# Patient Record
Sex: Female | Born: 1965 | Hispanic: No | Marital: Married | Smoking: Never smoker
Health system: Southern US, Community
[De-identification: ages and names within clinical notes are randomized; demographics above are authoritative.]

---

## 2008-12-14 ENCOUNTER — Ambulatory Visit: Payer: Self-pay | Admitting: Obstetrics & Gynecology

## 2008-12-25 ENCOUNTER — Ambulatory Visit (HOSPITAL_COMMUNITY): Admission: RE | Admit: 2008-12-25 | Discharge: 2008-12-25 | Payer: Self-pay | Admitting: Family Medicine

## 2009-01-09 ENCOUNTER — Ambulatory Visit: Payer: Self-pay | Admitting: Obstetrics and Gynecology

## 2009-04-17 ENCOUNTER — Ambulatory Visit: Payer: Self-pay | Admitting: Obstetrics and Gynecology

## 2009-05-15 ENCOUNTER — Ambulatory Visit: Payer: Self-pay | Admitting: Gastroenterology

## 2009-05-15 DIAGNOSIS — R197 Diarrhea, unspecified: Secondary | ICD-10-CM

## 2009-05-15 DIAGNOSIS — R112 Nausea with vomiting, unspecified: Secondary | ICD-10-CM

## 2009-05-16 LAB — CONVERTED CEMR LAB
ALT: 13 units/L (ref 0–35)
Albumin: 3.5 g/dL (ref 3.5–5.2)
Alkaline Phosphatase: 80 units/L (ref 39–117)
Basophils Absolute: 0 10*3/uL (ref 0.0–0.1)
Eosinophils Relative: 1.6 % (ref 0.0–5.0)
HCT: 34.9 % — ABNORMAL LOW (ref 36.0–46.0)
Lymphs Abs: 1.3 10*3/uL (ref 0.7–4.0)
Monocytes Absolute: 0.5 10*3/uL (ref 0.1–1.0)
Monocytes Relative: 7.6 % (ref 3.0–12.0)
Neutrophils Relative %: 70.9 % (ref 43.0–77.0)
Platelets: 289 10*3/uL (ref 150.0–400.0)
Potassium: 3.5 meq/L (ref 3.5–5.1)
RDW: 14.7 % — ABNORMAL HIGH (ref 11.5–14.6)
Sodium: 142 meq/L (ref 135–145)
Total Bilirubin: 0.6 mg/dL (ref 0.3–1.2)
Total Protein: 6.5 g/dL (ref 6.0–8.3)
WBC: 6.7 10*3/uL (ref 4.5–10.5)

## 2009-05-27 ENCOUNTER — Ambulatory Visit: Payer: Self-pay | Admitting: Gastroenterology

## 2009-05-27 ENCOUNTER — Encounter (INDEPENDENT_AMBULATORY_CARE_PROVIDER_SITE_OTHER): Payer: Self-pay | Admitting: *Deleted

## 2009-05-27 ENCOUNTER — Telehealth (INDEPENDENT_AMBULATORY_CARE_PROVIDER_SITE_OTHER): Payer: Self-pay | Admitting: *Deleted

## 2009-05-27 ENCOUNTER — Encounter: Payer: Self-pay | Admitting: Gastroenterology

## 2009-05-28 ENCOUNTER — Telehealth: Payer: Self-pay | Admitting: Gastroenterology

## 2009-05-29 ENCOUNTER — Encounter: Payer: Self-pay | Admitting: Gastroenterology

## 2009-05-29 ENCOUNTER — Telehealth (INDEPENDENT_AMBULATORY_CARE_PROVIDER_SITE_OTHER): Payer: Self-pay | Admitting: *Deleted

## 2009-07-09 ENCOUNTER — Ambulatory Visit: Payer: Self-pay | Admitting: Gastroenterology

## 2010-04-09 IMAGING — US US PELVIS COMPLETE MODIFY
1 series · 14 of 25 positions shown · non-contrast
Comparison: 

CLINICAL DATA: TRANSABDOMINAL AND TRANSVAGINAL ULTRASOUND OF PELVIS
TECHNIQUE: Both transabdominal and transvaginal ultrasound
examinations of the pelvis were performed including evaluation of
the uterus, ovaries, adnexal regions, and pelvic cul-de-sac.

[Series 1: us transvaginal non-ob · 0.27mm/px · 44 acquisitions, 14 frames shown]
[im 1/44]
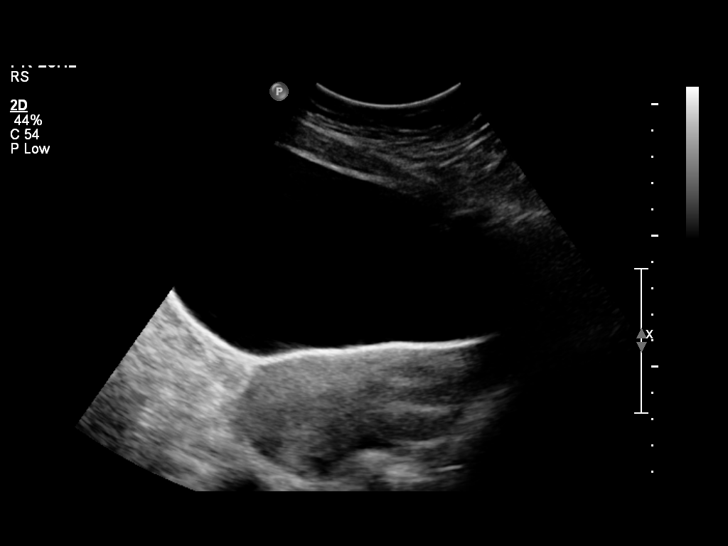
[im 4/44]
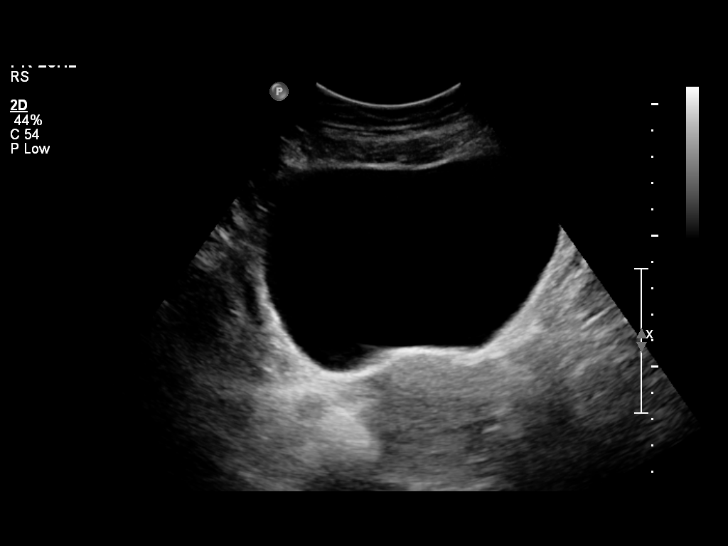
[im 8/44]
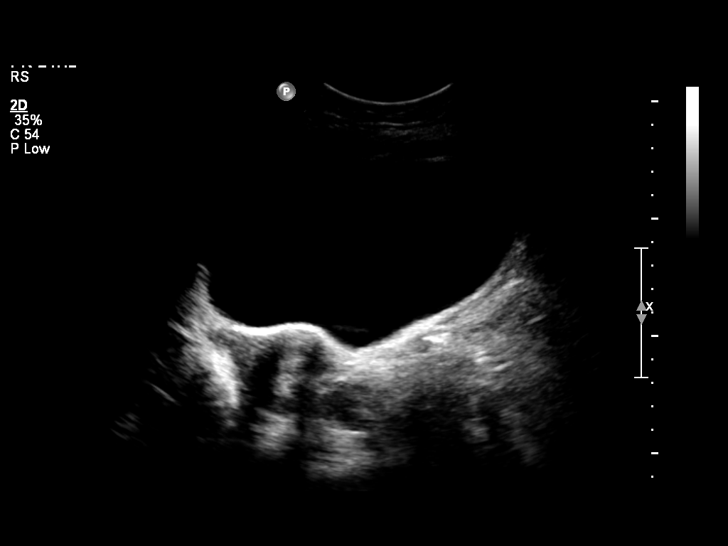
[im 11/44]
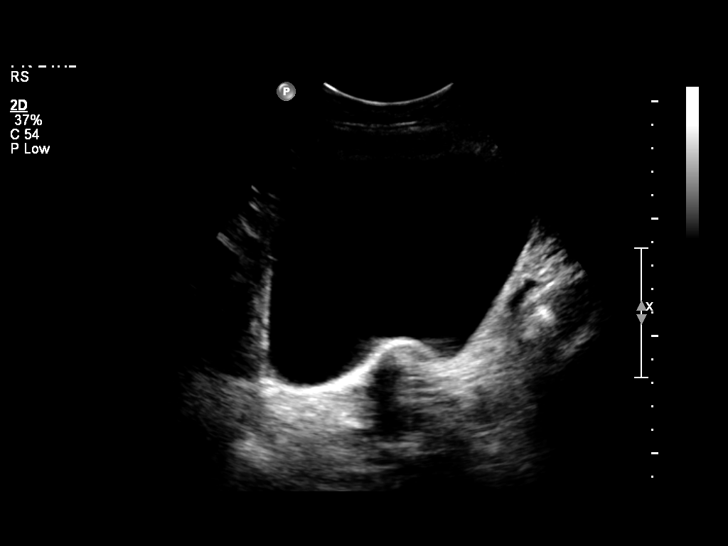
[im 15/44]
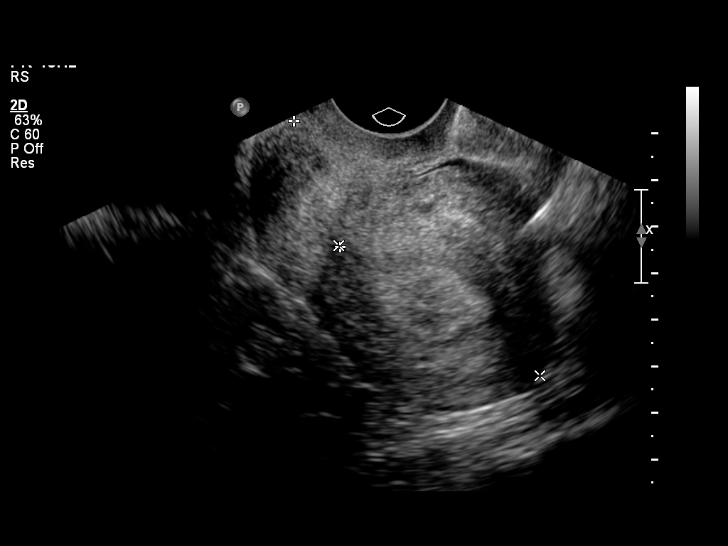
[im 17/44]
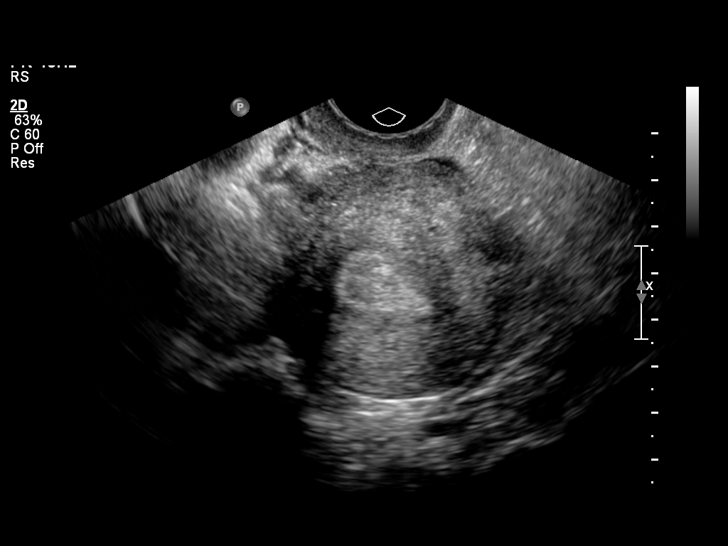
[im 20/44]
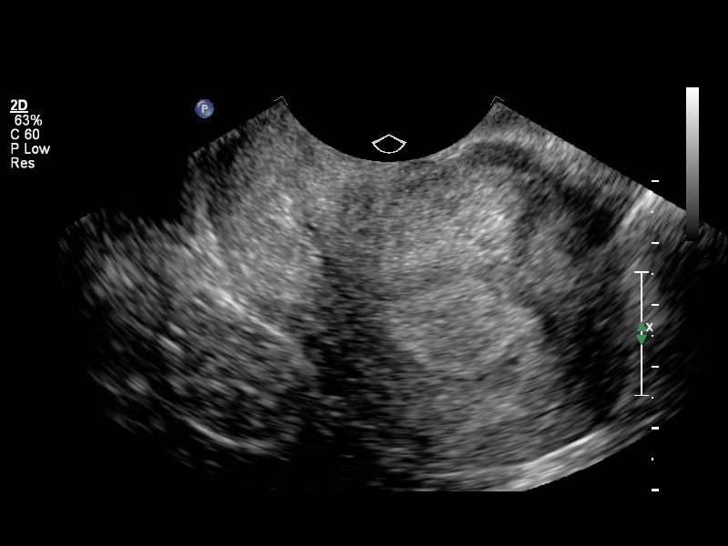
[im 24/44]
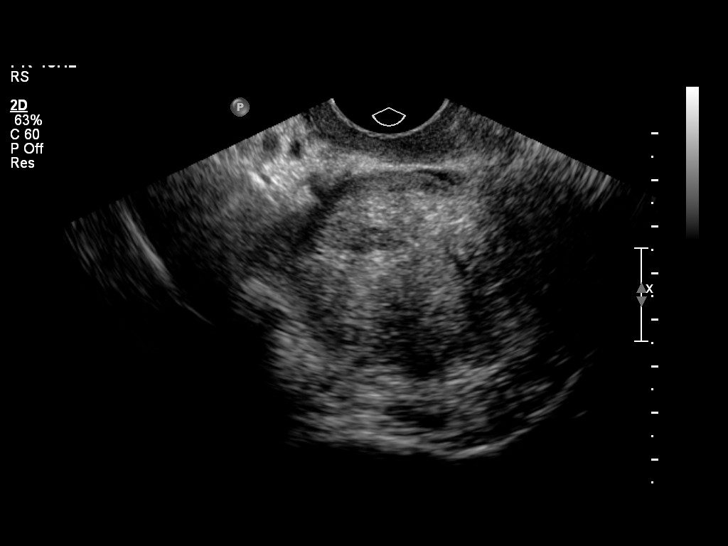
[im 27/44]
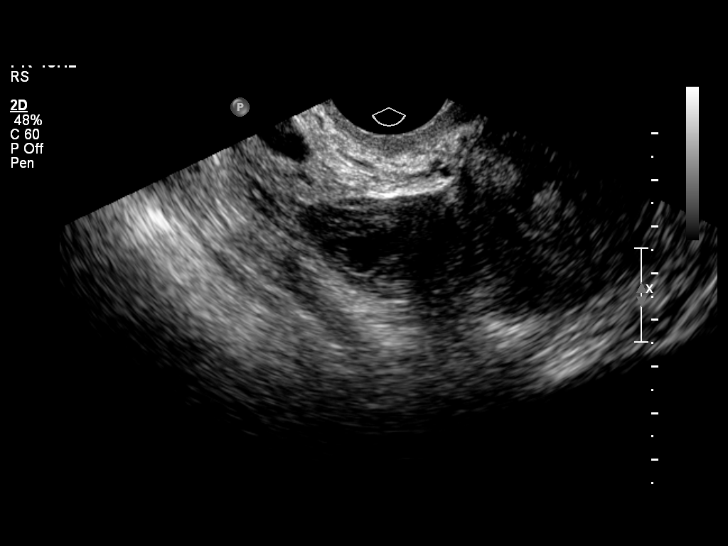
[im 29/44]
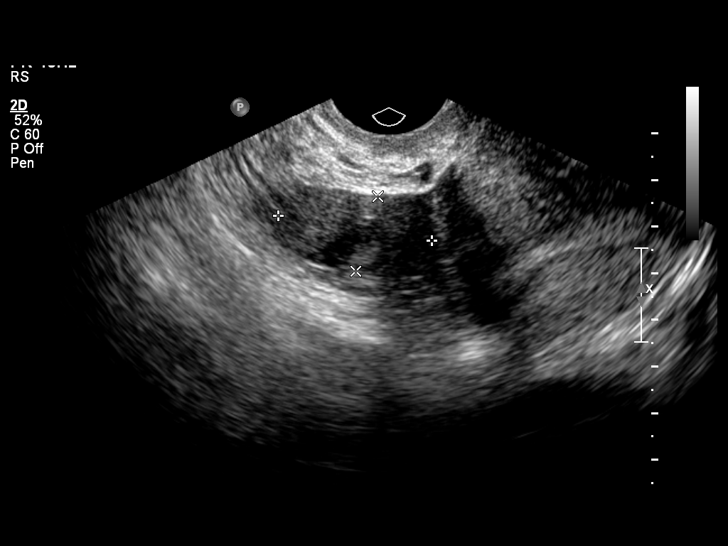
[im 33/44]
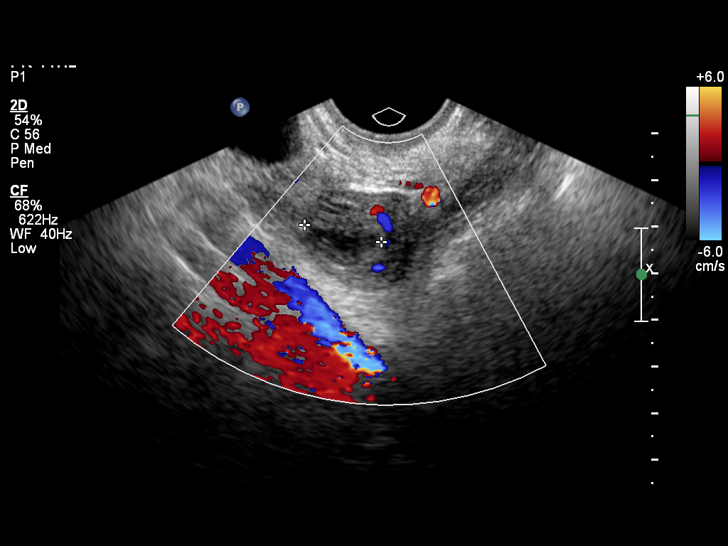
[im 36/44]
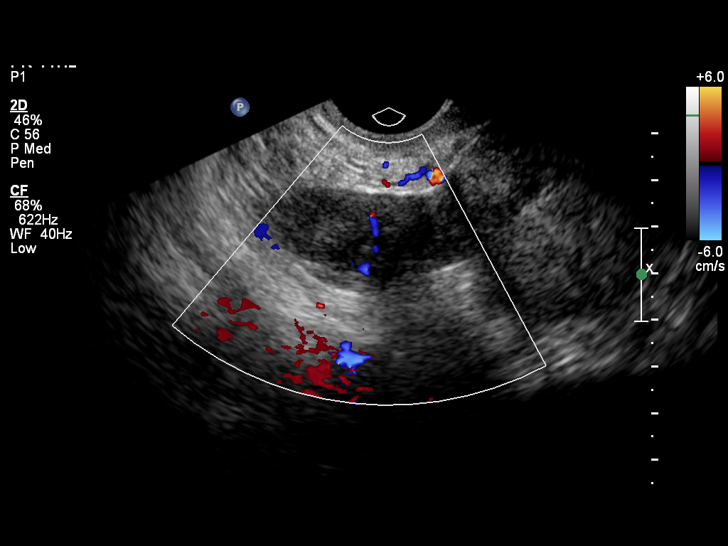
[im 40/44]
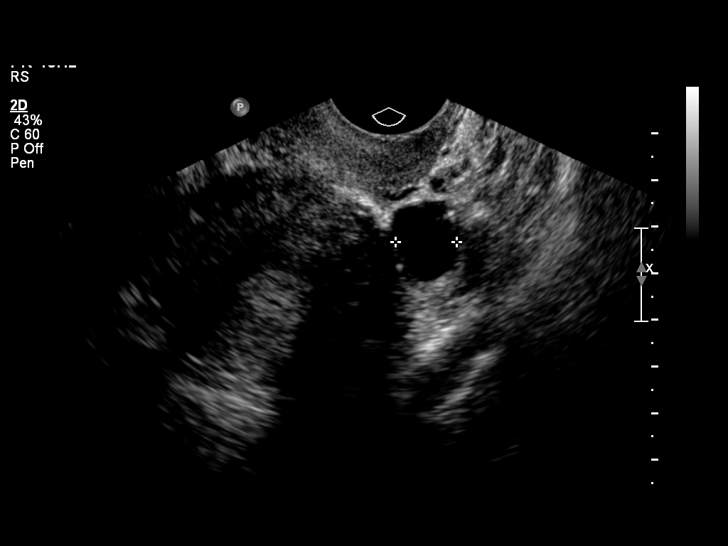
[im 44/44]
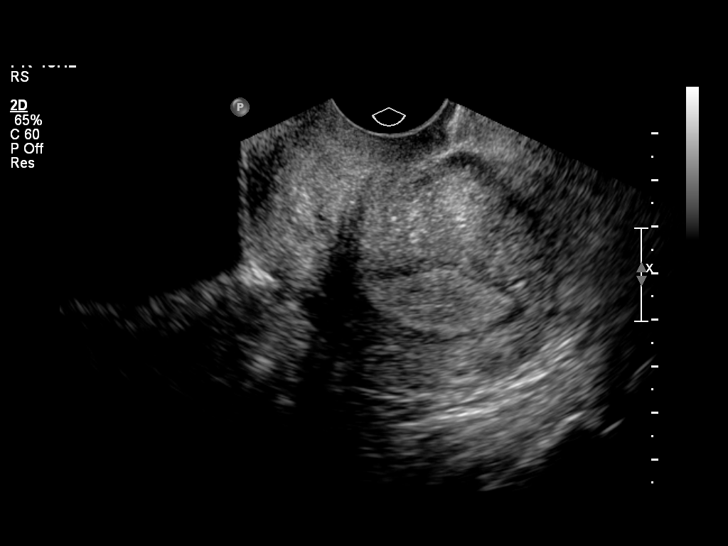

[14 of 25 positions shown; findings below may reference images not displayed]

FINDINGS: Uterus:  No fibroids or other uterine masses identified.

Endometrium:  Within normal limits in appearance and thickness,
measuring 12mm.

Right Ovary:  Normal size and appearance.

Left Ovary:  Normal size and appearance.

Other Findings:  No adnexal mass or free fluid identified.
IMPRESSION: Normal study.  No evidence of pelvic mass or other significant
abnormality.

## 2010-11-11 LAB — POCT URINALYSIS DIP (DEVICE)
Hgb urine dipstick: NEGATIVE
Ketones, ur: NEGATIVE mg/dL
Protein, ur: NEGATIVE mg/dL
Specific Gravity, Urine: 1.015 (ref 1.005–1.030)
Urobilinogen, UA: 0.2 mg/dL (ref 0.0–1.0)
pH: 8.5 — ABNORMAL HIGH (ref 5.0–8.0)

## 2010-12-16 NOTE — Group Therapy Note (Signed)
Lacey Keller, Lacey Keller NO.:  [id number]   MEDICAL RECORD NO.:  [id number]          PATIENT TYPE:  WOC   LOCATION:  [facility]                   FACILITY:  [facility]   PHYSICIAN:  Scheryl Darter, MD       DATE OF BIRTH:  Dec 18, 1965   DATE OF SERVICE:  12/14/2008                                  CLINIC NOTE   HISTORY OF PRESENT ILLNESS:  The patient comes for followup after being  diagnosed with a left ovarian cyst by ultrasound done at [facility].  The patient is a 45 year old [place] female gravida 0, last menstrual  period Dec 01, 2008, has had right lower quadrant pain intermittently for  months.  The pain follows her menses and lasts for up to 2 weeks.  She  also has daily nausea and she says during the time that she is having  pain,  she also has diarrhea.  No fevers.  No abnormal discharge and  periods are regular and lasts just a few a days and are not heavy.  She  would like to conceive.  She has been married for 16 years.   PAST MEDICAL HISTORY:  Hemorrhoids   PAST SURGICAL HISTORY:  Hemorrhoidectomy done in 2006 in [place]   SOCIAL HISTORY:  The patient is married.  She denies alcohol, tobacco,  or drug use.  She speaks some English, but the interpreter is here to  help today.   REVIEW OF SYSTEMS:  Includes increased urinary frequency and occasional  loss of urine.  No dysuria.   ALLERGIES:  Allergy to penicillin.   MEDICATIONS:  She has pain medicine that she cannot name.   FAMILY HISTORY:  Unremarkable.   PHYSICAL EXAMINATION:  GENERAL:  The patient in no acute distress.  VITAL SIGNS:  Weight 163.6 pounds.  Height 5 feet 5, blood pressure  117/71, pulse 80, and temperature 97.2.  ABDOMEN:  Nondistended, soft, nontender, and no mass.  PELVIC:  External genitalia, vagina, and cervix appeared normal.  She  reports some very mild inconsistent cervical motion tenderness.  Uterus  is normal size.  No adnexal masses or tenderness.   IMPRESSION:  1. Right  lower quadrant pain and ovarian cyst by history.  We do not      have the ultrasound report today.  We will try to obtain this from      Dr. Dimple Casey in [facility].  2. Gastrointestinal symptoms with diarrhea and nausea.   PLAN:  Repeat ultrasound.  She will return in 2 weeks to review the  result.  She may also need a GI referral.      Scheryl Darter, MD     JA/MEDQ  D:  12/14/2008  T:  12/15/2008  Job:  [id number]

## 2015-03-12 ENCOUNTER — Encounter: Payer: Self-pay | Admitting: Gastroenterology

## 2016-11-09 ENCOUNTER — Emergency Department (HOSPITAL_BASED_OUTPATIENT_CLINIC_OR_DEPARTMENT_OTHER)
Admission: EM | Admit: 2016-11-09 | Discharge: 2016-11-09 | Disposition: A | Discharge status: Home / Self Care | Payer: Managed Care, Other (non HMO) | Attending: Emergency Medicine | Admitting: Emergency Medicine

## 2016-11-09 ENCOUNTER — Encounter (HOSPITAL_BASED_OUTPATIENT_CLINIC_OR_DEPARTMENT_OTHER): Payer: Self-pay | Admitting: Emergency Medicine

## 2016-11-09 DIAGNOSIS — S060X0A Concussion without loss of consciousness, initial encounter: Secondary | ICD-10-CM | POA: Insufficient documentation

## 2016-11-09 DIAGNOSIS — Y92410 Unspecified street and highway as the place of occurrence of the external cause: Secondary | ICD-10-CM | POA: Insufficient documentation

## 2016-11-09 DIAGNOSIS — Y939 Activity, unspecified: Secondary | ICD-10-CM | POA: Diagnosis not present

## 2016-11-09 DIAGNOSIS — Y999 Unspecified external cause status: Secondary | ICD-10-CM | POA: Diagnosis not present

## 2016-11-09 DIAGNOSIS — S0990XA Unspecified injury of head, initial encounter: Secondary | ICD-10-CM | POA: Diagnosis present

## 2016-11-09 NOTE — ED Notes (Signed)
Pt ambulatory with steady gait at d/c. States she does not need a note for work today
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
## 2019-05-08 ENCOUNTER — Encounter: Payer: Self-pay | Admitting: Gastroenterology
